# Patient Record
Sex: Female | Born: 2005 | Race: Black or African American | Hispanic: No | Marital: Single | Smoking: Never smoker
Health system: Southern US, Community
[De-identification: ages and names within clinical notes are randomized; demographics above are authoritative.]

## PROBLEM LIST (undated history)

## (undated) DIAGNOSIS — J45909 Unspecified asthma, uncomplicated: Secondary | ICD-10-CM

## (undated) HISTORY — PX: NO PAST SURGERIES: SHX2092

---
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
## 2023-04-13 ENCOUNTER — Emergency Department (HOSPITAL_BASED_OUTPATIENT_CLINIC_OR_DEPARTMENT_OTHER)
Admission: EM | Admit: 2023-04-13 | Discharge: 2023-04-13 | Disposition: A | Discharge status: Home / Self Care | Payer: Self-pay | Attending: Emergency Medicine | Admitting: Emergency Medicine

## 2023-04-13 ENCOUNTER — Other Ambulatory Visit: Payer: Self-pay

## 2023-04-13 ENCOUNTER — Encounter (HOSPITAL_BASED_OUTPATIENT_CLINIC_OR_DEPARTMENT_OTHER): Payer: Self-pay

## 2023-04-13 DIAGNOSIS — W501XXA Accidental kick by another person, initial encounter: Secondary | ICD-10-CM | POA: Insufficient documentation

## 2023-04-13 DIAGNOSIS — S99929A Unspecified injury of unspecified foot, initial encounter: Secondary | ICD-10-CM

## 2023-04-13 DIAGNOSIS — S99921A Unspecified injury of right foot, initial encounter: Secondary | ICD-10-CM | POA: Insufficient documentation

## 2023-04-13 MED ORDER — IBUPROFEN 800 MG PO TABS
800.0000 mg | ORAL_TABLET | Freq: Once | ORAL | Status: AC
  Administered 2023-04-13: 800 mg via ORAL
  Filled 2023-04-13: qty 1

## 2023-04-13 NOTE — ED Triage Notes (Signed)
 Patient arrives with complaints of left great toe pain. Patient states that she injured her toenail one day ago and the pain has worsened. Concerned that nail on toe is having issues healing due to ongoing bleeding. Rates pain a 10/10.
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
## 2023-04-13 NOTE — Discharge Instructions (Addendum)
 You were seen today for toenail injury on the left big toe.  Recommend that you continue to keep the area clean with soap and water.  Change gauze/bandage at least once a day.  Continuing to look for signs of infection which are swelling, redness of the skin that continues to grow, foul-smelling or discolored drainage.  If any of these are present, return to ED for further evaluation.  Otherwise recommend continue to monitor symptoms and follow-up with PCP as needed.

## 2023-09-24 ENCOUNTER — Encounter: Payer: Self-pay | Admitting: *Deleted
